# Patient Record
Sex: Male | Born: 2000 | Race: White | Hispanic: No | Marital: Single
Health system: Midwestern US, Academic
[De-identification: ages and names within clinical notes are randomized; demographics above are authoritative.]

---

## 2021-03-18 ENCOUNTER — Encounter: Admit: 2021-03-18 | Discharge: 2021-03-18 | Payer: Private Health Insurance - Indemnity

## 2021-03-18 ENCOUNTER — Inpatient Hospital Stay: Admit: 2021-03-18 | Discharge: 2021-03-18 | Payer: Private Health Insurance - Indemnity

## 2021-03-18 ENCOUNTER — Inpatient Hospital Stay: Admit: 2021-03-18 | Payer: Private Health Insurance - Indemnity

## 2021-03-18 DIAGNOSIS — T1490XA Injury, unspecified, initial encounter: Secondary | ICD-10-CM

## 2021-03-18 LAB — POC GLUCOSE
POC GLUCOSE: 241 mg/dL — ABNORMAL HIGH (ref 70–100)
POC GLUCOSE: 456 mg/dL — ABNORMAL HIGH (ref 70–100)

## 2021-03-18 LAB — COMPREHENSIVE METABOLIC PANEL
ALBUMIN: 3.9 g/dL — ABNORMAL HIGH (ref 3.5–5.0)
ALK PHOSPHATASE: 65 U/L — ABNORMAL LOW (ref 25–110)
ANION GAP: 8 K/UL — ABNORMAL HIGH (ref 3–12)
AST: 33 U/L (ref 7–40)
BLD UREA NITROGEN: 11 mg/dL (ref 7–25)
CO2: 25 MMOL/L (ref 21–30)
CREATININE: 0.8 mg/dL (ref 0.4–1.24)
EGFR: >60 mL/min — ABNORMAL LOW (ref >60)
GLUCOSE,PANEL: 212 mg/dL — ABNORMAL HIGH (ref 70–100)
SODIUM: 137 MMOL/L — ABNORMAL LOW (ref 137–147)

## 2021-03-18 LAB — CBC AND DIFF
ABSOLUTE BASO COUNT: 0 K/UL (ref 0–0.20)
ABSOLUTE EOS COUNT: 0 K/UL (ref 0–0.45)
ABSOLUTE MONO COUNT: 0.7 K/UL (ref 0–0.80)
BASOPHILS %: 0 % (ref 0–2)
MPV: 7.1 FL — ABNORMAL HIGH (ref 7–11)
PLATELET COUNT: 317 K/UL (ref 150–400)
RDW: 12 % (ref 11–15)
WBC COUNT: 11 K/UL — ABNORMAL HIGH (ref 4.5–11.0)

## 2021-03-18 LAB — MAGNESIUM: MAGNESIUM: 1.7 mg/dL — ABNORMAL LOW (ref 1.6–2.6)

## 2021-03-18 LAB — PHOSPHORUS: PHOSPHORUS: 4.6 mg/dL — ABNORMAL HIGH (ref 2.0–4.5)
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
## 2021-03-18 MED ORDER — MELATONIN 5 MG PO TAB
5 mg | Freq: Every evening | ORAL | 0 refills | Status: AC | PRN
Start: 2021-03-18 — End: ?

## 2021-03-18 MED ORDER — LIDOCAINE 5 % TP PTMD
1 | Freq: Every day | TOPICAL | 0 refills | Status: AC
Start: 2021-03-18 — End: ?
  Administered 2021-03-18: 17:00:00 1 via TOPICAL

## 2021-03-18 MED ORDER — POLYETHYLENE GLYCOL 3350 17 GRAM PO PWPK
1 | Freq: Every day | ORAL | 0 refills | Status: AC
Start: 2021-03-18 — End: ?
  Administered 2021-03-19: 15:00:00 17 g via ORAL

## 2021-03-18 MED ORDER — ACETAMINOPHEN 500 MG PO TAB
1000 mg | ORAL | 0 refills | Status: AC
Start: 2021-03-18 — End: ?
  Administered 2021-03-18 – 2021-03-19 (×6): 1000 mg via ORAL

## 2021-03-18 MED ORDER — LACTATED RINGERS IV SOLP
INTRAVENOUS | 0 refills | Status: AC
Start: 2021-03-18 — End: ?
  Administered 2021-03-18: 19:00:00 1000.000 mL via INTRAVENOUS

## 2021-03-18 MED ORDER — OXYCODONE 5 MG PO TAB
5-15 mg | ORAL | 0 refills | Status: AC | PRN
Start: 2021-03-18 — End: ?
  Administered 2021-03-18 (×2): 15 mg via ORAL
  Administered 2021-03-19: 12:00:00 10 mg via ORAL
  Administered 2021-03-19: 03:00:00 15 mg via ORAL
  Administered 2021-03-19 (×2): 10 mg via ORAL

## 2021-03-18 MED ORDER — DEXTROSE 50 % IN WATER (D50W) IV SYRG
12.5-25 g | INTRAVENOUS | 0 refills | Status: AC | PRN
Start: 2021-03-18 — End: ?

## 2021-03-18 MED ORDER — ONDANSETRON 4 MG PO TBDI
4 mg | ORAL | 0 refills | Status: AC | PRN
Start: 2021-03-18 — End: ?

## 2021-03-18 MED ORDER — METHOCARBAMOL 750 MG PO TAB
750 mg | Freq: Three times a day (TID) | ORAL | 0 refills | Status: AC
Start: 2021-03-18 — End: ?
  Administered 2021-03-18 – 2021-03-19 (×4): 750 mg via ORAL

## 2021-03-18 MED ORDER — ONDANSETRON HCL (PF) 4 MG/2 ML IJ SOLN
4 mg | INTRAVENOUS | 0 refills | Status: AC | PRN
Start: 2021-03-18 — End: ?
  Administered 2021-03-19 – 2021-03-20 (×2): 4 mg via INTRAVENOUS

## 2021-03-18 MED ORDER — ENOXAPARIN 30 MG/0.3 ML SC SYRG
30 mg | Freq: Two times a day (BID) | SUBCUTANEOUS | 0 refills | Status: AC
Start: 2021-03-18 — End: ?
  Administered 2021-03-19 (×2): 30 mg via SUBCUTANEOUS

## 2021-03-18 MED ORDER — POLYETHYLENE GLYCOL 3350 17 GRAM PO PWPK
1 | Freq: Every day | ORAL | 0 refills | Status: AC | PRN
Start: 2021-03-18 — End: ?

## 2021-03-18 MED ORDER — BACITRACIN ZINC 500 UNIT/GRAM TP OINT
Freq: Two times a day (BID) | TOPICAL | 0 refills | Status: AC
Start: 2021-03-18 — End: ?
  Administered 2021-03-18: 17:00:00 via TOPICAL

## 2021-03-18 MED ORDER — INSULIN ASPART 100 UNIT/ML SC FLEXPEN
0-6 [IU] | Freq: Before meals | SUBCUTANEOUS | 0 refills | Status: AC
Start: 2021-03-18 — End: ?
  Administered 2021-03-18: 19:00:00 1 [IU] via SUBCUTANEOUS
  Administered 2021-03-19: 2 [IU] via SUBCUTANEOUS
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
## 2021-03-18 NOTE — Progress Notes
RT Adult Assessment Note    NAME:Tony Hancock             MRN: [id number]             DOB:02-Mar-2001          AGE: 21 y.o.  ADMISSION DATE: 03/18/2021             DAYS ADMITTED: LOS: 0 days    RT Treatment Plan:       Protocol Plan: Procedures  PEP Therapy: Place a nursing order for "IS Q1h While Awake" for any of Lung Expansion indicators    Additional Comments:  Impressions of the patient: Pt. Exhibiting no respiratory distress.  Intervention(s)/outcome(s): IS indicated for Lung expansion therapy  Recommendations to the care team: IS indicated for Lung expansion therapy      Vital Signs:  Pulse: 95  RR: 16 PER MINUTE  SpO2: 95 %  O2 Device: None (Room air)  Liter Flow:    O2%:    Breath Sounds:    Respiratory Effort:

## 2021-03-19 ENCOUNTER — Encounter: Admit: 2021-03-19 | Discharge: 2021-03-19 | Payer: Private Health Insurance - Indemnity

## 2021-03-19 ENCOUNTER — Inpatient Hospital Stay: Admit: 2021-03-19 | Discharge: 2021-03-19 | Payer: Private Health Insurance - Indemnity

## 2021-03-19 MED ADMIN — INSULIN GLARGINE 100 UNIT/ML (3 ML) SC INJ PEN [163596]: 16 [IU] | SUBCUTANEOUS | @ 14:00:00 | Stop: 2021-03-19 | NDC 00088221905

## 2021-03-19 MED ADMIN — OXYCODONE 10 MG PO TAB [166908]: 10 mg | ORAL | @ 23:00:00 | Stop: 2021-03-20 | NDC 68084096811

## 2021-03-20 ENCOUNTER — Encounter: Admit: 2021-03-20 | Discharge: 2021-03-20 | Payer: Private Health Insurance - Indemnity

## 2021-03-20 MED FILL — OXYCODONE 5 MG PO TAB: 5 mg | ORAL | 3 days supply | Qty: 35 | Fill #1 | Status: CP

## 2021-03-20 MED FILL — METHOCARBAMOL 750 MG PO TAB: 750 mg | ORAL | 9 days supply | Qty: 25 | Fill #1 | Status: CP

## 2021-03-27 ENCOUNTER — Encounter: Admit: 2021-03-27 | Discharge: 2021-03-27 | Payer: Private Health Insurance - Indemnity

## 2021-03-27 DIAGNOSIS — S52592D Other fractures of lower end of left radius, subsequent encounter for closed fracture with routine healing: Secondary | ICD-10-CM

## 2021-03-27 DIAGNOSIS — S3210XD Unspecified fracture of sacrum, subsequent encounter for fracture with routine healing: Secondary | ICD-10-CM

## 2021-03-28 ENCOUNTER — Ambulatory Visit: Admit: 2021-03-28 | Discharge: 2021-03-28 | Payer: Private Health Insurance - Indemnity

## 2021-03-28 ENCOUNTER — Encounter: Admit: 2021-03-28 | Discharge: 2021-03-28 | Payer: Private Health Insurance - Indemnity

## 2021-03-28 DIAGNOSIS — S52592D Other fractures of lower end of left radius, subsequent encounter for closed fracture with routine healing: Secondary | ICD-10-CM

## 2021-03-28 DIAGNOSIS — S52532A Colles' fracture of left radius, initial encounter for closed fracture: Secondary | ICD-10-CM

## 2021-03-28 DIAGNOSIS — S3210XD Unspecified fracture of sacrum, subsequent encounter for fracture with routine healing: Secondary | ICD-10-CM

## 2021-03-28 DIAGNOSIS — S32810A Multiple fractures of pelvis with stable disruption of pelvic ring, initial encounter for closed fracture: Secondary | ICD-10-CM
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
## 2021-04-03 ENCOUNTER — Encounter: Admit: 2021-04-03 | Discharge: 2021-04-03 | Payer: Private Health Insurance - Indemnity

## 2021-04-03 DIAGNOSIS — S52532A Colles' fracture of left radius, initial encounter for closed fracture: Secondary | ICD-10-CM

## 2021-04-03 DIAGNOSIS — S32810A Multiple fractures of pelvis with stable disruption of pelvic ring, initial encounter for closed fracture: Secondary | ICD-10-CM

## 2021-04-29 ENCOUNTER — Encounter: Admit: 2021-04-29 | Discharge: 2021-04-29 | Payer: Private Health Insurance - Indemnity

## 2021-04-29 DIAGNOSIS — S52532A Colles' fracture of left radius, initial encounter for closed fracture: Secondary | ICD-10-CM

## 2021-04-29 DIAGNOSIS — S32810A Multiple fractures of pelvis with stable disruption of pelvic ring, initial encounter for closed fracture: Secondary | ICD-10-CM

## 2021-04-30 ENCOUNTER — Encounter: Admit: 2021-04-30 | Discharge: 2021-04-30

## 2021-04-30 ENCOUNTER — Ambulatory Visit: Admit: 2021-04-30 | Discharge: 2021-04-30

## 2021-04-30 DIAGNOSIS — S52532A Colles' fracture of left radius, initial encounter for closed fracture: Secondary | ICD-10-CM

## 2021-04-30 DIAGNOSIS — S32810A Multiple fractures of pelvis with stable disruption of pelvic ring, initial encounter for closed fracture: Secondary | ICD-10-CM

## 2021-04-30 DIAGNOSIS — S32810D Multiple fractures of pelvis with stable disruption of pelvic ring, subsequent encounter for fracture with routine healing: Secondary | ICD-10-CM
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
